# Patient Record
Sex: Female | Born: 1965 | Race: Black or African American | Hispanic: No | Marital: Married
Health system: Midwestern US, Community
[De-identification: ages and names within clinical notes are randomized; demographics above are authoritative.]

---

## 2018-10-03 ENCOUNTER — Ambulatory Visit: Admit: 2018-10-03 | Discharge: 2018-10-03 | Payer: BLUE CROSS/BLUE SHIELD

## 2018-10-03 DIAGNOSIS — J209 Acute bronchitis, unspecified: Secondary | ICD-10-CM

## 2018-10-03 MED ORDER — FLUCONAZOLE 150 MG PO TABS
150 MG | ORAL_TABLET | Freq: Every day | ORAL | 0 refills | Status: AC
Start: 2018-10-03 — End: 2018-10-05

## 2018-10-03 MED ORDER — AMOXICILLIN-POT CLAVULANATE 875-125 MG PO TABS
875-125 MG | ORAL_TABLET | Freq: Two times a day (BID) | ORAL | 0 refills | Status: AC
Start: 2018-10-03 — End: 2018-10-13

## 2018-10-03 NOTE — Progress Notes (Signed)
10/03/2018    Clement Husbands (DOB:  1966-06-10) is a 52 y.o. female, here for evaluation of the following medical concerns: sore throat, sinus congestion, cough since 09/29/18.    HPI  52 yo female here today with c/o sore throat, congestion and cough since 09/29/18. Rates illness 5/10 on pain scale. Taking OTC meds mucinex, nyquil and ibuprofen and they help.    Review of Systems   Constitutional: Positive for fatigue and fever.   HENT: Positive for congestion, postnasal drip and sore throat.    Eyes: Negative.    Respiratory: Positive for cough.    Cardiovascular: Negative.    Gastrointestinal: Negative.    Endocrine: Negative.    Genitourinary: Negative.    Musculoskeletal: Negative.    Skin: Negative.    Allergic/Immunologic: Positive for environmental allergies.   Neurological: Negative.    Hematological: Negative.    Psychiatric/Behavioral: Negative.           Prior to Visit Medications    Not on File        Allergies   Allergen Reactions   ??? Ibuprofen Swelling       History reviewed. No pertinent past medical history.    History reviewed. No pertinent surgical history.    Social History     Socioeconomic History   ??? Marital status: Married     Spouse name: Not on file   ??? Number of children: Not on file   ??? Years of education: Not on file   ??? Highest education level: Not on file   Occupational History   ??? Not on file   Social Needs   ??? Financial resource strain: Not on file   ??? Food insecurity:     Worry: Not on file     Inability: Not on file   ??? Transportation needs:     Medical: Not on file     Non-medical: Not on file   Tobacco Use   ??? Smoking status: Never Smoker   ??? Smokeless tobacco: Never Used   Substance and Sexual Activity   ??? Alcohol use: Never     Frequency: Never   ??? Drug use: Never   ??? Sexual activity: Not on file   Lifestyle   ??? Physical activity:     Days per week: Not on file     Minutes per session: Not on file   ??? Stress: Not on file   Relationships   ??? Social connections:     Talks on  phone: Not on file     Gets together: Not on file     Attends religious service: Not on file     Active member of club or organization: Not on file     Attends meetings of clubs or organizations: Not on file     Relationship status: Not on file   ??? Intimate partner violence:     Fear of current or ex partner: Not on file     Emotionally abused: Not on file     Physically abused: Not on file     Forced sexual activity: Not on file   Other Topics Concern   ??? Not on file   Social History Narrative   ??? Not on file        History reviewed. No pertinent family history.    Vitals:    10/03/18 1201   BP: 103/68   Pulse: 73   Resp: 18   Temp: 97.5 ??F (36.4 ??C)  TempSrc: Tympanic   SpO2: 96%   Weight: 155 lb (70.3 kg)     There is no height or weight on file to calculate BMI.    Physical Exam   Constitutional: She is oriented to person, place, and time. She appears well-developed and well-nourished.   HENT:   Head: Normocephalic and atraumatic.   Right Ear: External ear normal.   Left Ear: External ear normal.   Green mucous, red oropharynx, visible green post nasal drip.    Eyes: Pupils are equal, round, and reactive to light. Conjunctivae and EOM are normal.   Neck: Normal range of motion. Neck supple.   Cardiovascular: Normal rate, regular rhythm, normal heart sounds and intact distal pulses.   Pulmonary/Chest: Effort normal. She has wheezes.   Exp wheezes occasionally   Abdominal: Soft. Bowel sounds are normal.   Musculoskeletal: Normal range of motion.   Neurological: She is alert and oriented to person, place, and time.   Skin: Skin is warm and dry. Capillary refill takes less than 2 seconds.   Psychiatric: She has a normal mood and affect. Her behavior is normal. Thought content normal.   Vitals reviewed.      ASSESSMENT/PLAN:  1. Acute bronchitis-augmentin 875 mg po bid X 10 days.Diflucan on day 5 and 10 of antibiotics. Increase fluid intake. Continue mucinex daily until cough is gone.   2. Return to clinic if no  improvement in symptoms.        An  electronic signature was used to authenticate this note.    --Paul Half, APRN - CNP on 10/03/2018 at 12:56 PM

## 2019-09-16 IMAGING — MR MRI RIGHT SHOULDER WITHOUT CONTRAST
4 of 6 series · 22 of 40 positions shown · IV contrast (gadolinium)
Comparison: None.

MRI RIGHT SHOULDER WITHOUT CONTRAST, 09/16/2019 [DATE]: 
CLINICAL INDICATION: Evaluate for recurrent rotator cuff tear. Patient reports 
right rotator cuff repair last year. History of frozen shoulder for 5 months.
TECHNIQUE: Multiplanar, multiecho position MR images of the right shoulder were 
performed without intravenous gadolinium enhancement.

[Series 201: survey mst · axial · 10.0mm · 0.99mm/px · z∈[-40,+175]mm · 4 of 15 slices shown]
[im 1/15]
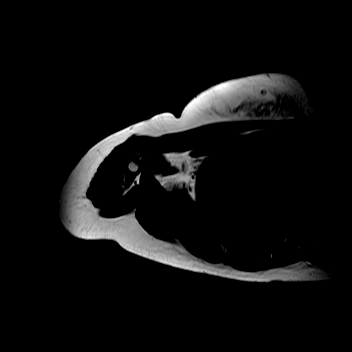
[im 5/15]
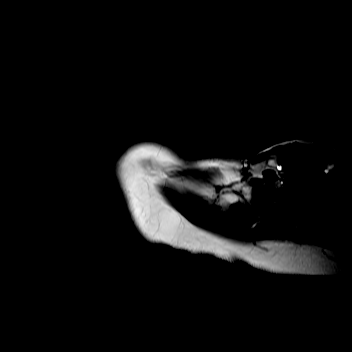
[im 10/15]
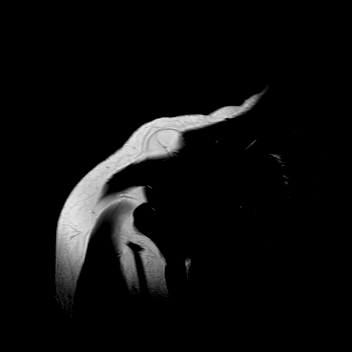
[im 15/15]
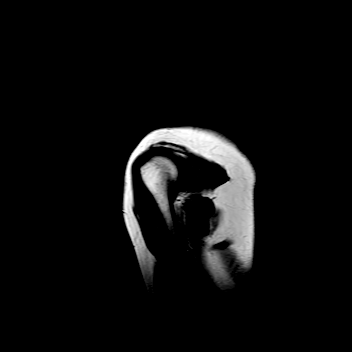

[Series 301: (person_name)_(person_name)_(person_name)* · axial · 3.0mm · 0.35mm/px · z∈[-36,+53]mm · 8 of 28 slices shown]
[im 1/28]
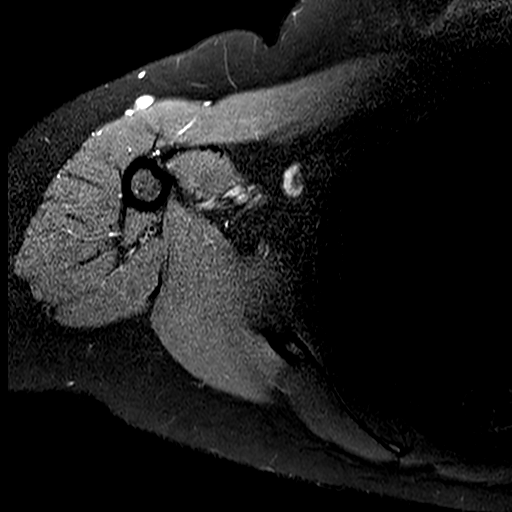
[im 4/28]
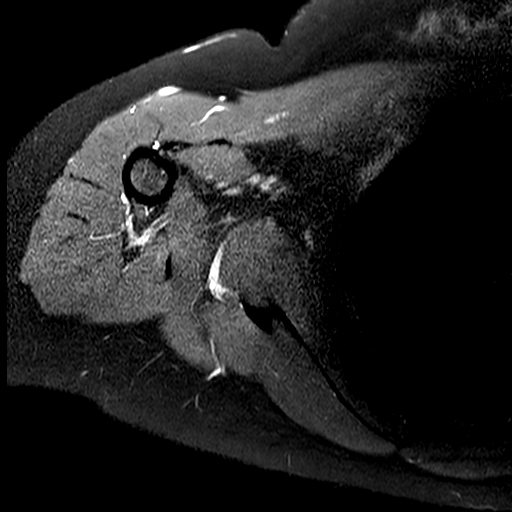
[im 8/28]
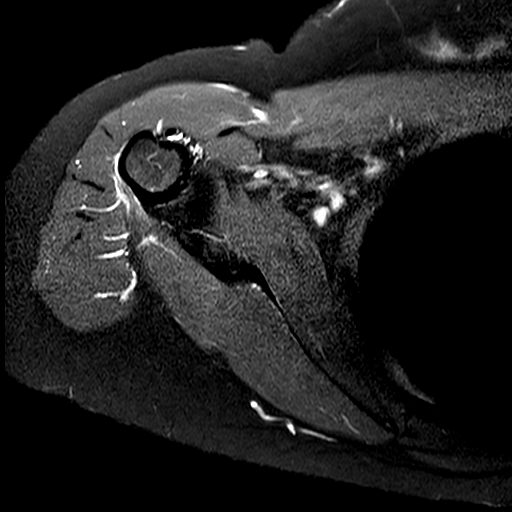
[im 12/28]
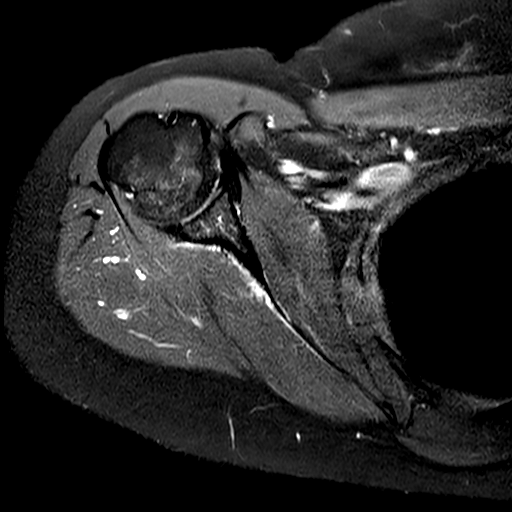
[im 16/28]
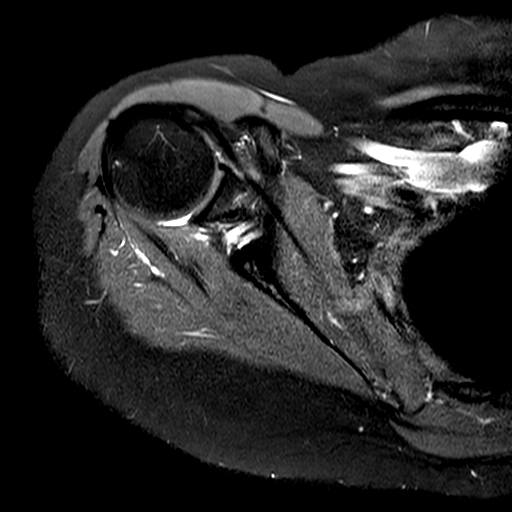
[im 20/28]
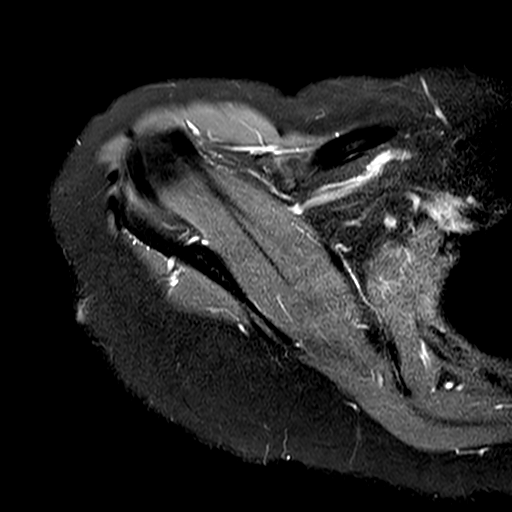
[im 24/28]
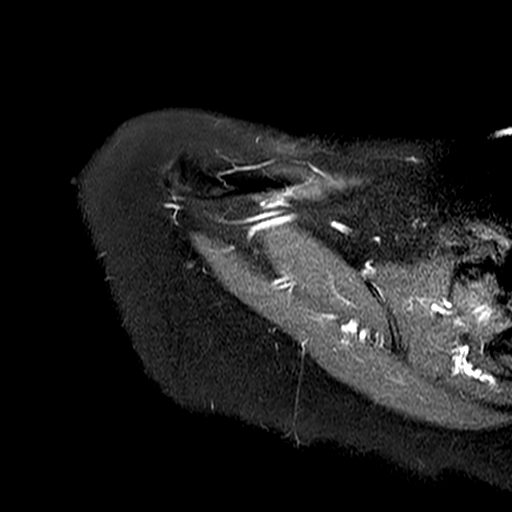
[im 28/28]
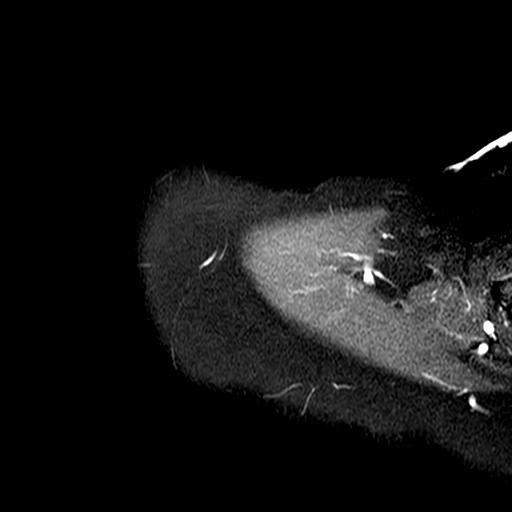

[Series 401: t2_fs_sag* · oblique · 3.0mm · 0.37mm/px · 7 of 30 slices shown]
[im 1/30]
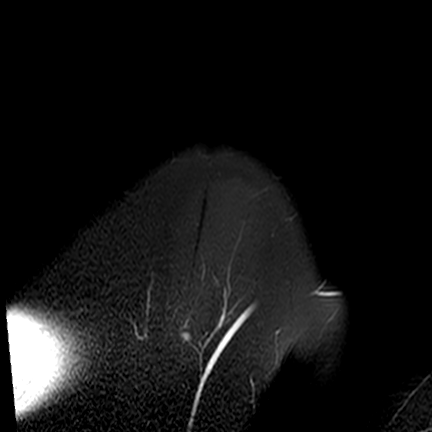
[im 5/30]
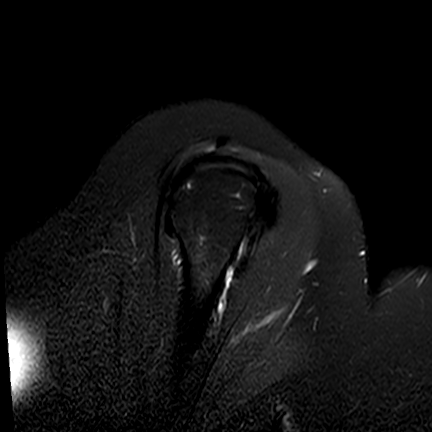
[im 9/30]
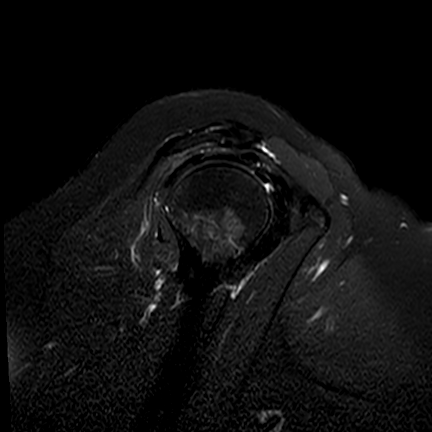
[im 13/30]
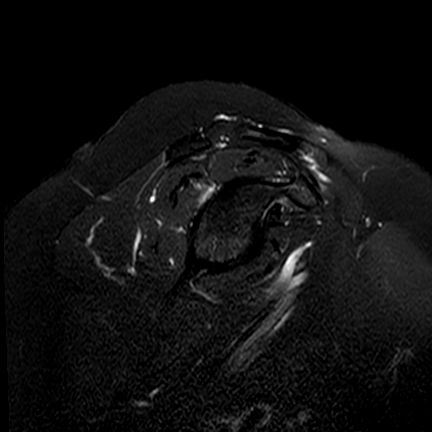
[im 17/30]
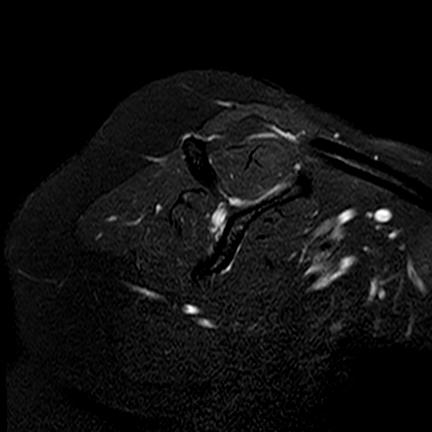
[im 21/30]
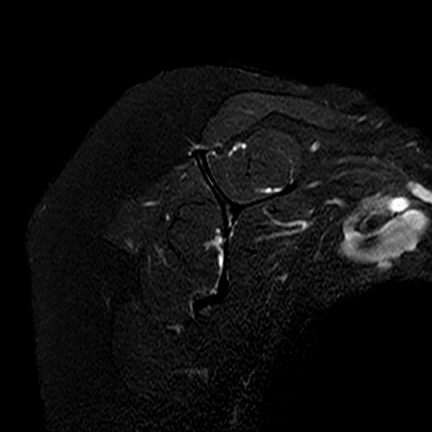
[im 25/30]
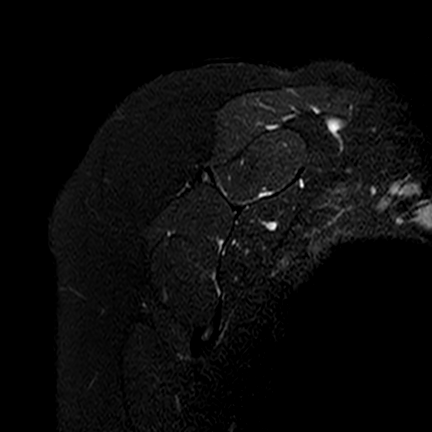

[Series 501: t1_sag* · oblique · 3.0mm · 0.29mm/px · 3 of 30 slices shown]
[im 5/30]
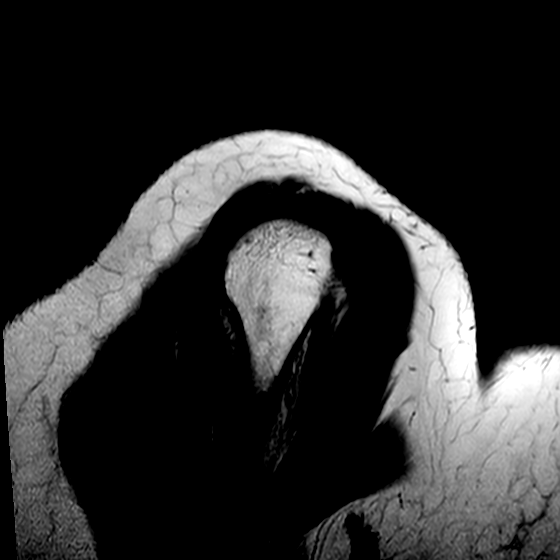
[im 17/30]
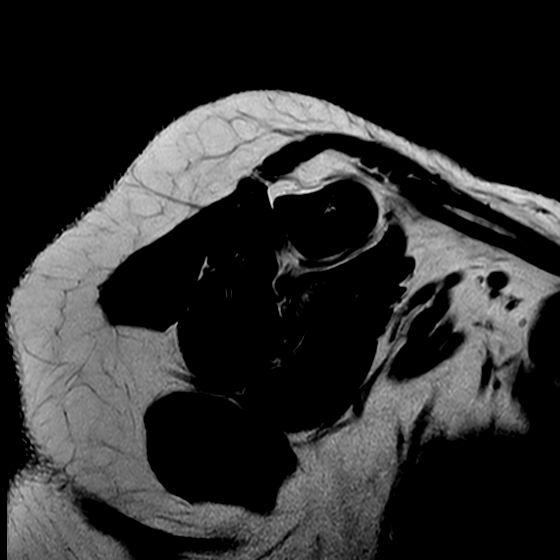
[im 25/30]
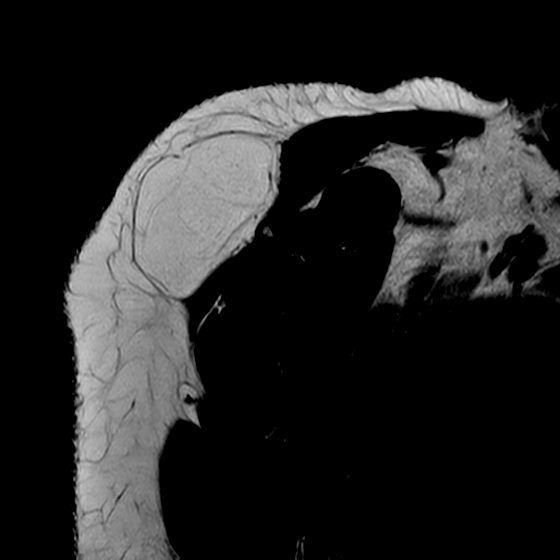

[22 of 40 positions shown; findings below may reference images not displayed]

FINDINGS: ROTATOR CUFF: The supraspinatus and infraspinatus tendons are intact. No 
high-grade or partial rotator cuff tear. The subscapularis and teres minor 
tendons are preserved. No postsurgical susceptibility artifact identified. The 
rotator cuff musculature is symmetric without mass, signal abnormality or 
atrophy. 
ACROMIOCLAVICULAR JOINT: The acromioclavicular joint is preserved. The 
coracoacromial ligament is intact without prominent spurring at the acromial 
attachment. The acromioclavicular and coracoclavicular ligaments are preserved. 
The acromium is normal in morphology. 
GLENOHUMERAL JOINT: The humeral head is well located within the glenoid fossa. 
The glenoid labrum is preserved. No paralabral cyst. The intra-articular portion 
of the long head of the biceps tendon is negative. No shoulder joint effusion. 
The inferior glenohumeral ligament/capsule is normal in thickness and signal 
intensity. 
BONES AND SOFT TISSUES: The bone marrow signal intensity is negative for 
fracture. No Hill-Sachs defect. The axillary region is negative. 5.4 x 3.0 x
cm fatty mass in the posterior shoulder subcutaneous tissues with thin 
septations and no nodular nonadipose components, most consistent with lipoma.
IMPRESSION: 1. No intra-articular pathology identified. 
2. No imaging features to suggest adhesive capsulitis. 
3. 5.4 x 3.0 x 5.9 cm fatty mass in the posterior shoulder subcutaneous tissues, 
most consistent with lipoma.

## 2021-02-19 IMAGING — MR MRI CERVICAL SPINE WITHOUT CONTRAST
4 series · 25 of 48 positions shown · IV contrast (gadolinium)
Comparison: None

MRI CERVICAL SPINE WITHOUT CONTRAST, 02/19/2021 [DATE]: 
CLINICAL INDICATION: Neck pain
TECHNIQUE: Sagittal T1, Sagittal T2, Sagittal STIR, Axial TSE and Axial 1K00O 
images of the cervical spine were performed without intravenous gadolinium 
enhancement.

[Series 101: survey* · axial · 10.0mm · 1.56mm/px · z∈[-30,+199]mm · 9 of 15 slices shown]
[im 1/15]
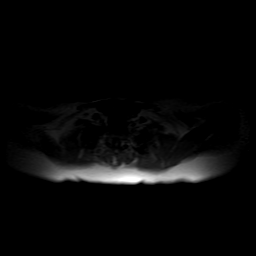
[im 2/15]
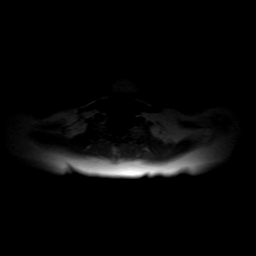
[im 4/15]
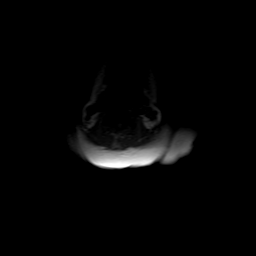
[im 6/15]
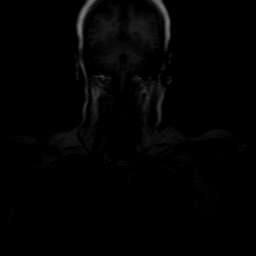
[im 8/15]
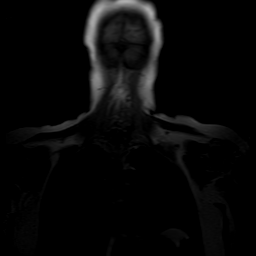
[im 9/15]
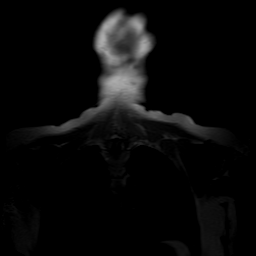
[im 11/15]
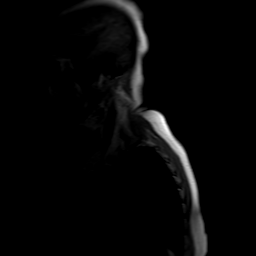
[im 13/15]
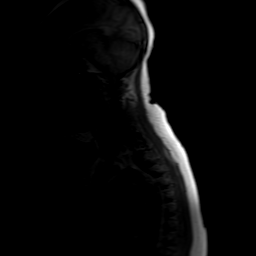
[im 15/15]
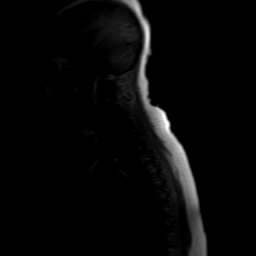

[Series 301: t1_(person_name) · sagittal · 3.0mm · 0.36mm/px · 8 of 15 slices shown]
[im 1/15]
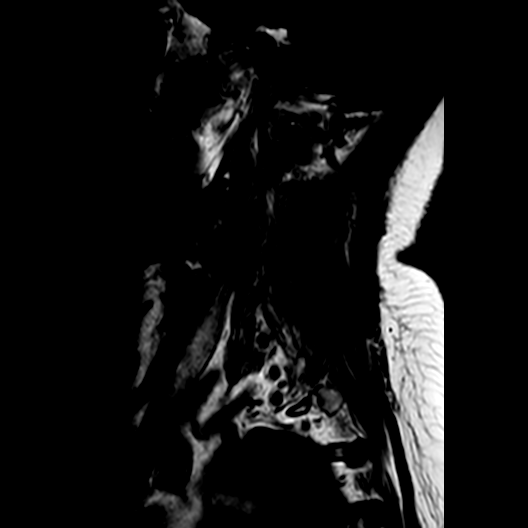
[im 2/15]
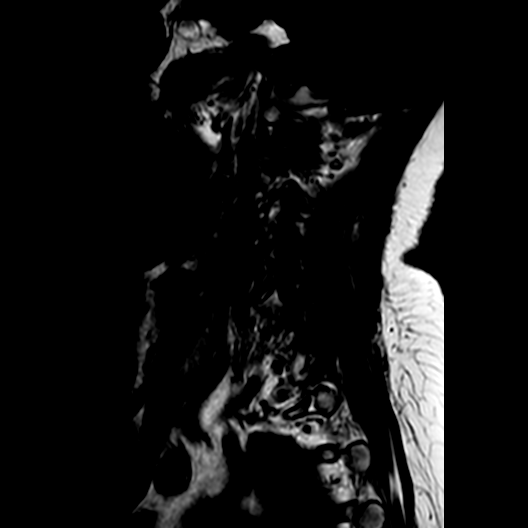
[im 5/15]
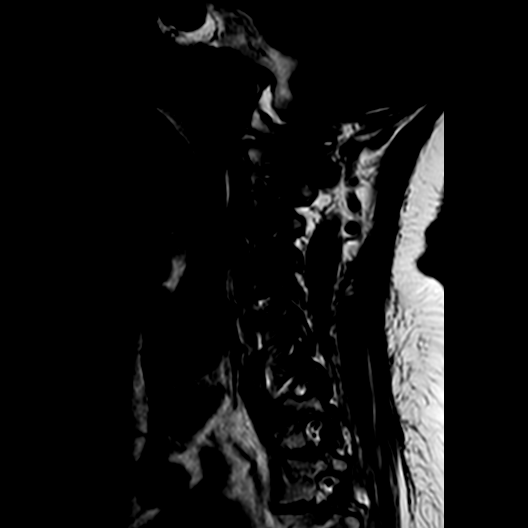
[im 7/15]
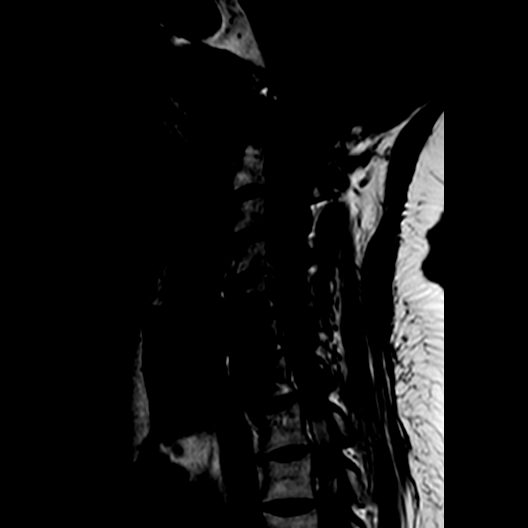
[im 8/15]
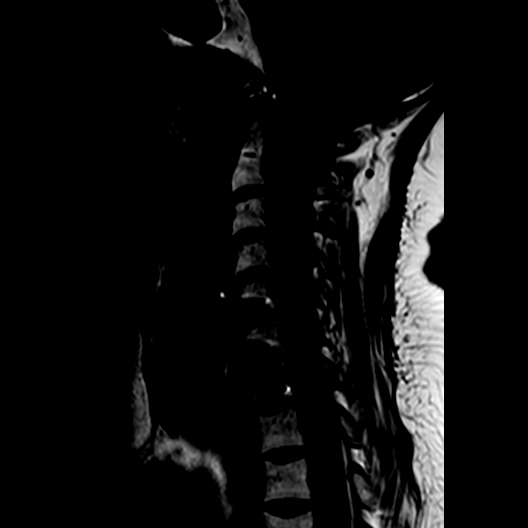
[im 10/15]
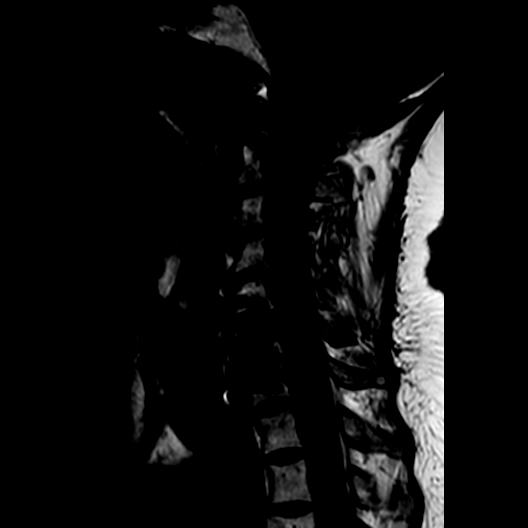
[im 13/15]
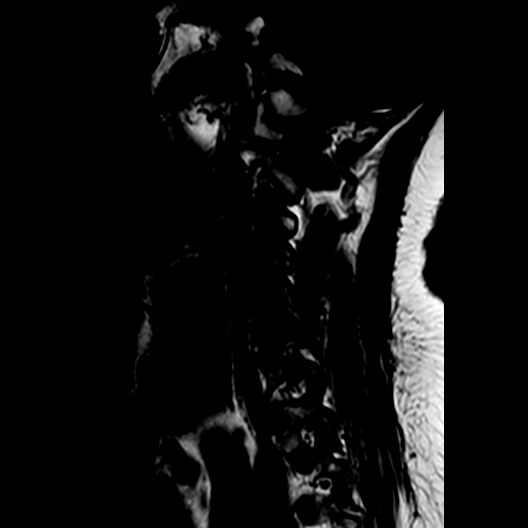
[im 15/15]
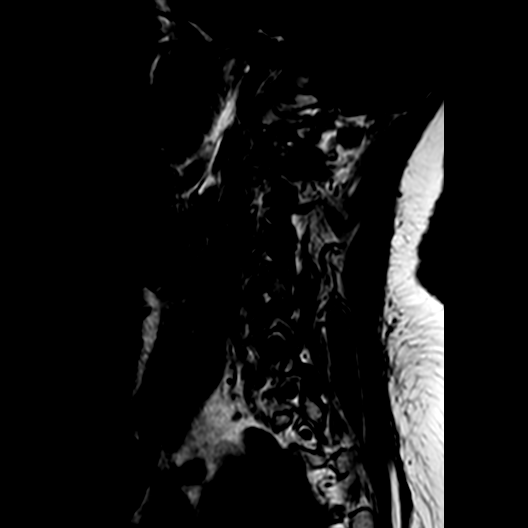

[Series 401: t2_sag · sagittal · 3.0mm · 0.36mm/px · 5 of 15 slices shown]
[im 1/15]
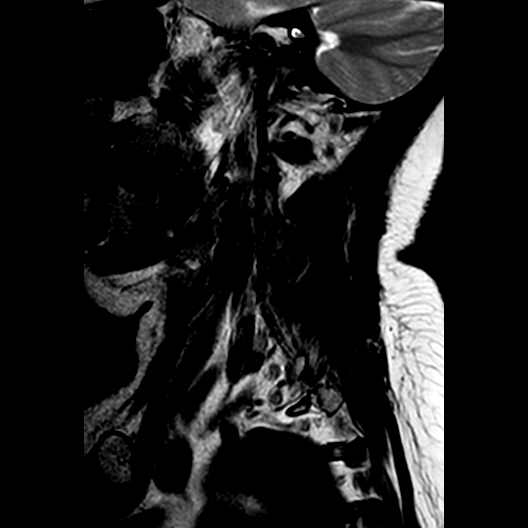
[im 2/15]
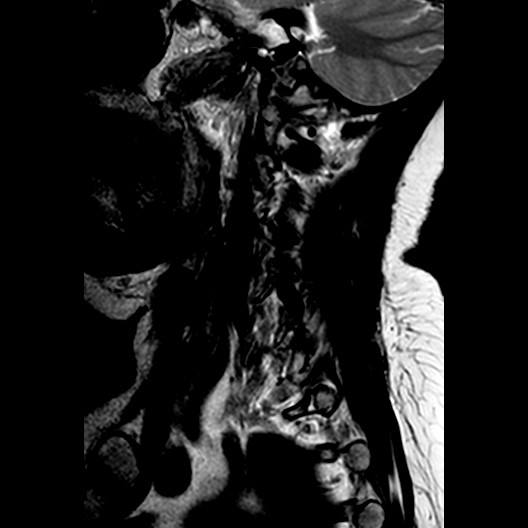
[im 5/15]
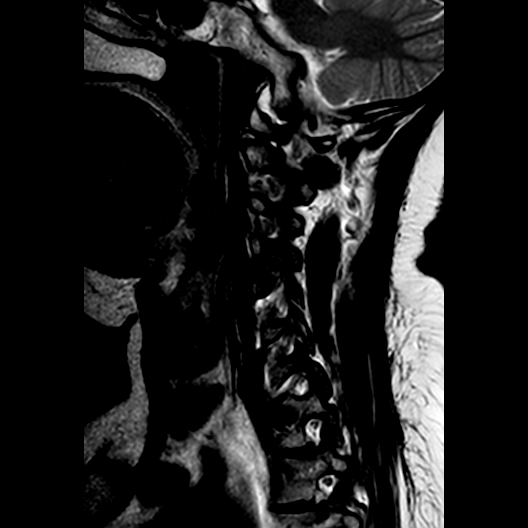
[im 8/15]
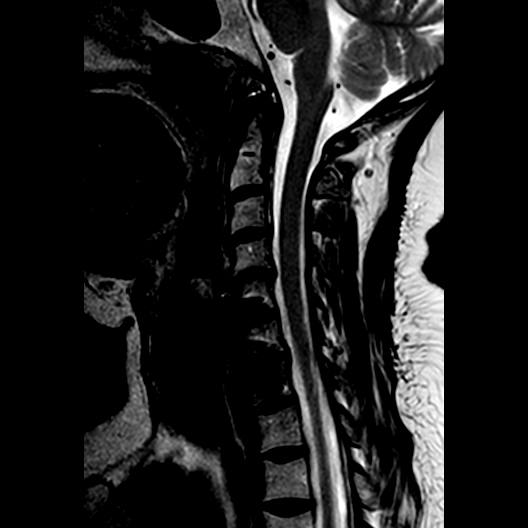
[im 13/15]
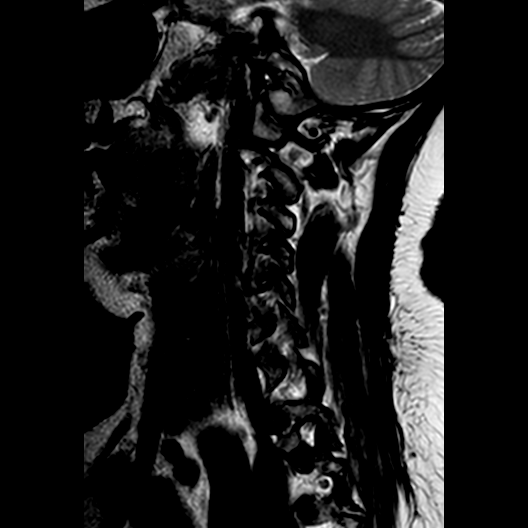

[Series 601: t2_tse_(id) (person_name) · axial · 3.0mm · 0.25mm/px · z∈[-44,+15]mm · 3 of 30 slices shown]
[im 5/30]
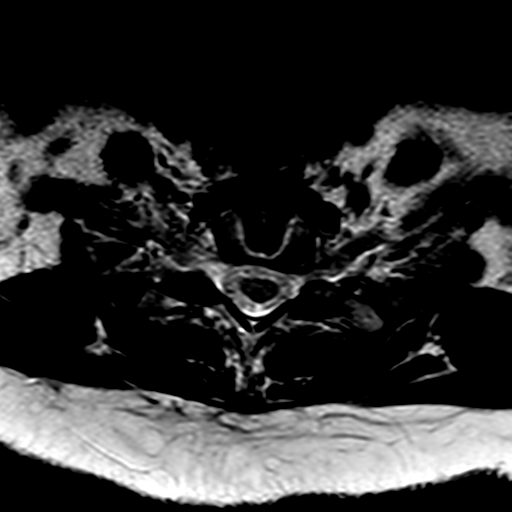
[im 15/30]
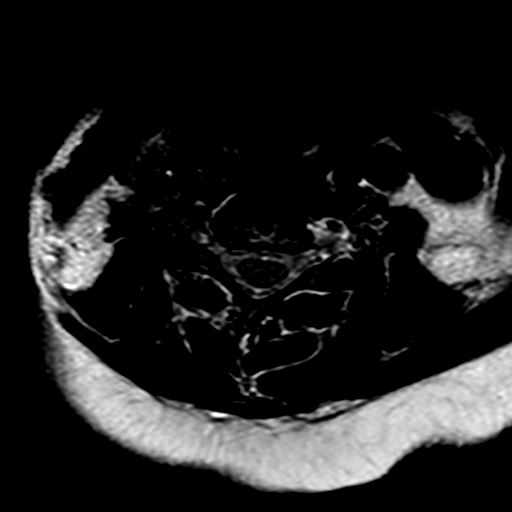
[im 25/30]
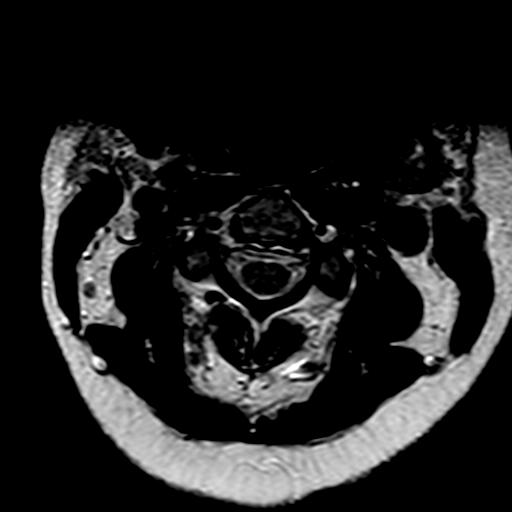

[25 of 48 positions shown; findings below may reference images not displayed]

FINDINGS: Cervical vertebral heights are intact. Patient is status post C5-C7 
ACDF with anterior plate and screw fusion. There is marked disc narrowing at 
C4-5, moderate at C7-T1. Cord signal is normal. The craniocervical junction is 
open. The dens and atlanto-dental interval are intact. Lower posterior fossa 
contents are unremarkable. There is no evidence for recent fracture or spinal 
malignancy. There are zygapophyseal facet degenerative changes. 
Axial images from C2-C3 through C7-T1 show mild disc bulge at C3-4 and C4-5 not 
touching the cord. There is no significant cervical stenosis. There is mild 
disc-osteophyte at C6-7 not touching the cord. There is moderate left foraminal 
stenosis at C4-5. Other cervical foramina are open..
IMPRESSION: Fusion changes as described. There is no significant residual canal stenosis. 
Moderate left foraminal stenosis at C4-5 appears to impinge the left C5 nerve 
root. There is disc bulge at this level not touching the cord. 
No Modic type I changes identified.

## 2022-04-14 IMAGING — MR MRI CERVICAL SPINE WITHOUT CONTRAST
4 of 8 series · 11 of 48 positions shown · IV contrast (gadolinium)
Comparison: MR exam of 02/19/2021.

________________________________________________________________________________________________ 
MRI CERVICAL SPINE WITHOUT CONTRAST, 04/14/2022 [DATE]: 
CLINICAL INDICATION: Cervicalgia. Chronic neck pain since 0811. All symptoms 
were produced on the right side of a nodule up to the left side 2 months ago. 
Left-sided pain and numbness on the mid biceps area. No trauma. History of 
cervical fusion..
TECHNIQUE: Multiplanar, multiecho position MR images of the cervical spine were 
performed without intravenous gadolinium enhancement. Patient was scanned on a 
1.5T magnet.

[Series 101: survey · axial · 10.0mm · 1.25mm/px · z∈[-6,+200]mm · 3 of 10 slices shown]
[im 1/10]
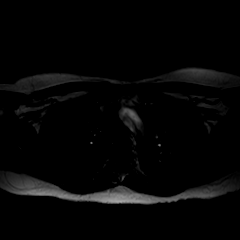
[im 5/10]
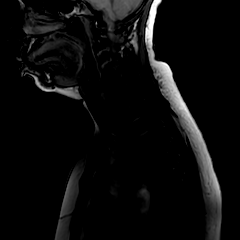
[im 10/10]
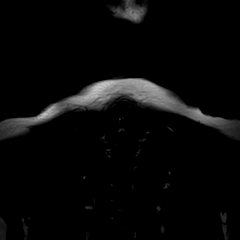

[Series 201: t2w_cor-surv · coronal · 5.0mm · 0.69mm/px · 2 of 7 slices shown]
[im 1/7]
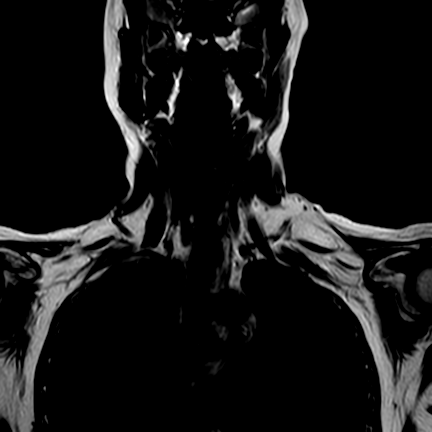
[im 7/7]
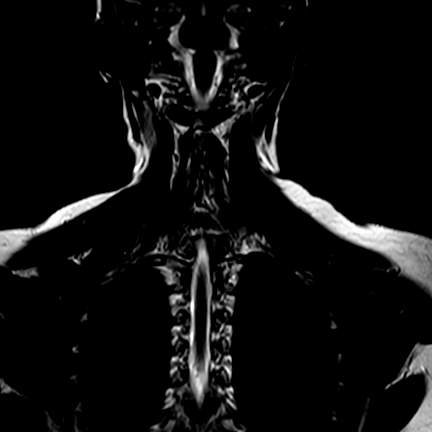

[Series 301: t1_sag · sagittal · 3.0mm · 0.35mm/px · 3 of 15 slices shown]
[im 1/15]
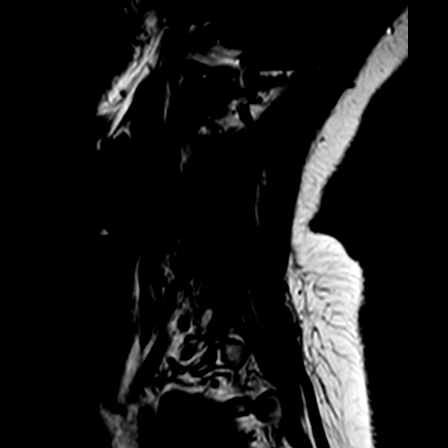
[im 8/15]
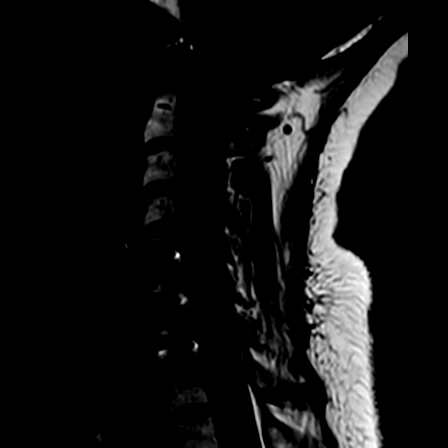
[im 15/15]
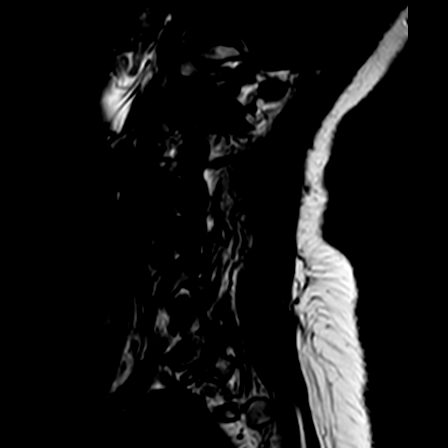

[Series 402: v3d spine axial · axial · 1.0mm · 0.22mm/px · z∈[-27,+98]mm · 3 of 23 slices shown]
[im 1/23]
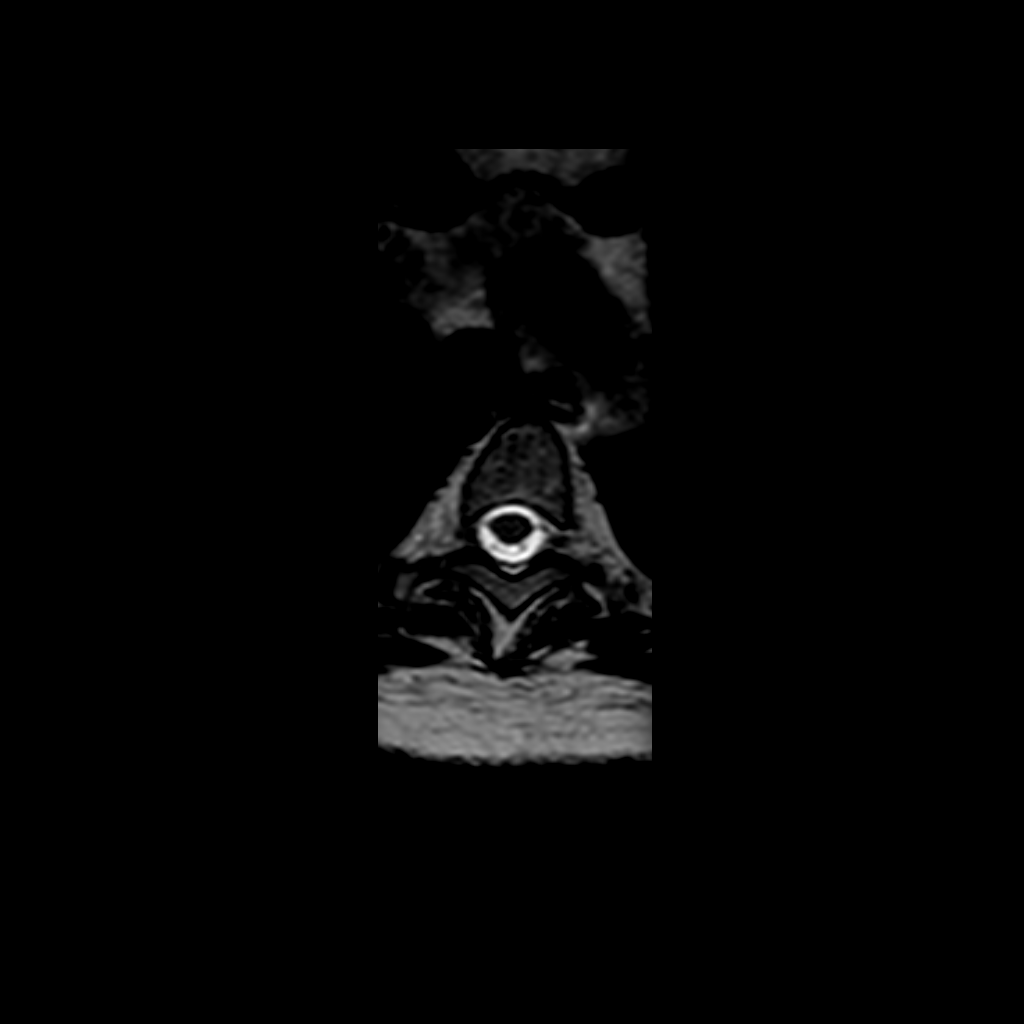
[im 12/23]
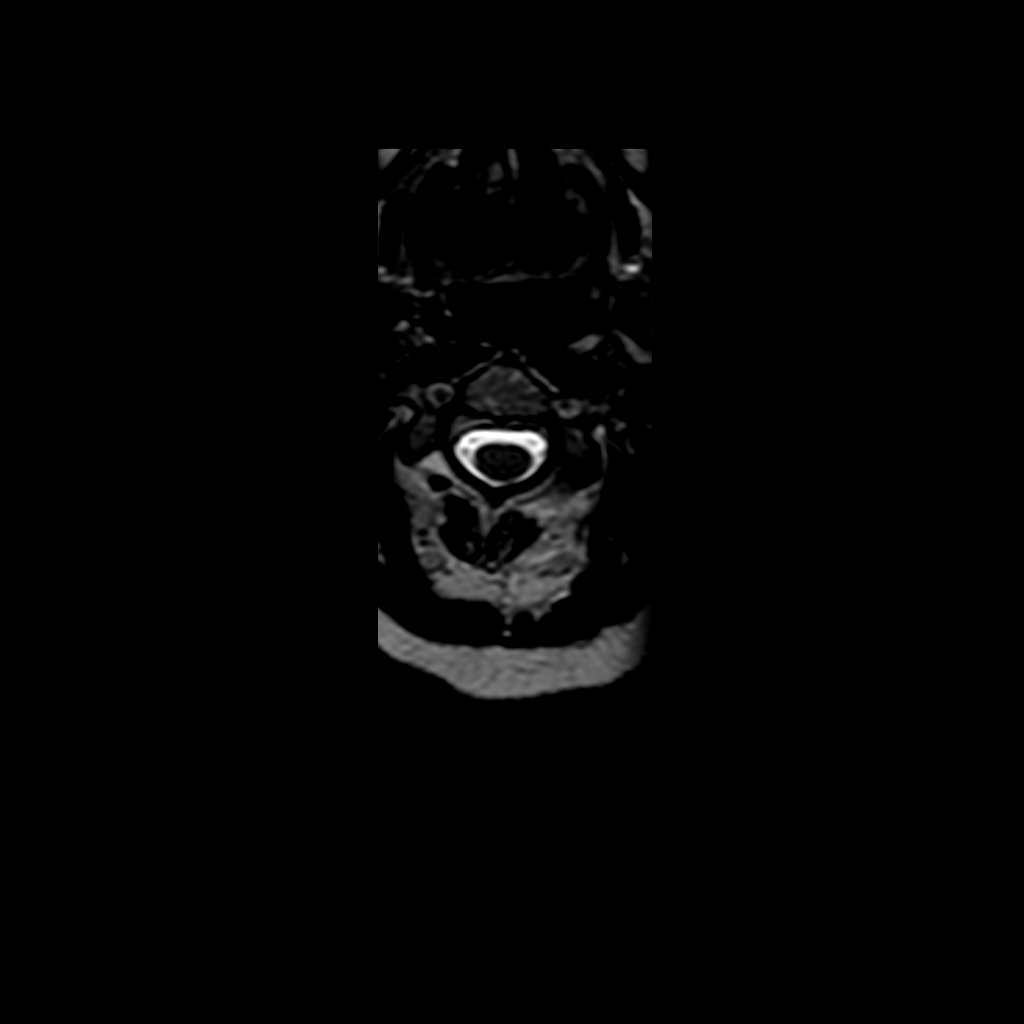
[im 23/23]
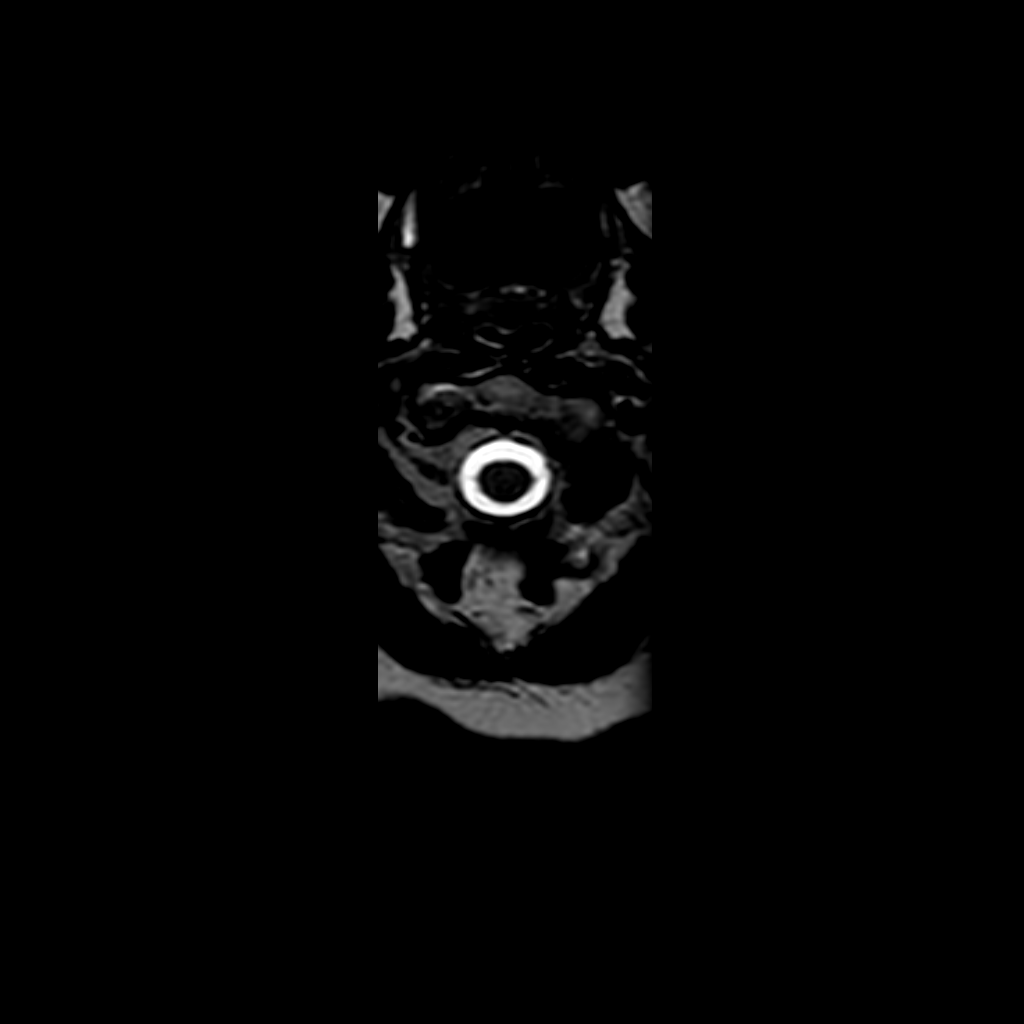

[11 of 48 positions shown; findings below may reference images not displayed]

FINDINGS: -------------------------------------------------------------------------------- 
----------------- 
GENERAL: 
Postsurgical changes of ACDF C5-C7. No vertebral body fracture. No 
spondylolisthesis. No marrow edema or stress response. 
-------------------------------------------------------------------------------- 
---------------- 
SEGMENTAL: 
Mild degenerative changes of the anterior C1-C2 articulation with mild 
osteophytic spurring. 
C2-C3: Normal disc height. No herniation. Normal facets. No spinal canal or 
neural foraminal stenosis. 
C3-C4: Normal disc height. No herniation. Normal facets. No spinal canal or 
neural foraminal stenosis. 
C4-C5: Mild disc height loss. Mild dorsal disc osteophyte complex. There is 
uncovertebral and facet hypertrophy with moderate to severe bilateral neural 
foraminal stenoses with potential for bilateral C5 neural impingement.  
C5-C6: Anterior fusion. No spinal canal or neural foraminal stenosis. 
C6-C7: Anterior fusion. No spinal canal or neural foraminal stenosis. 
C7-T1: Normal disc height. No herniation. Normal facets. No spinal canal or 
neural foraminal stenosis. 
-------------------------------------------------------------------------------- 
--------------- 
Compared to the prior MR exam Seed been slight interval progression of the 
neural foraminal stenoses at C4-C5. Remainder otherwise unchanged.
IMPRESSION: 1.  Post surgical changes of anterior fusion C5-C7. 
2.  Spondylotic changes with bilateral moderate to severe neural foraminal 
stenoses with potential for neural impingement.

## 2022-04-26 IMAGING — MR MRI LEFT SHOULDER WITHOUT CONTRAST
4 of 6 series · 23 of 40 positions shown · IV contrast (gadolinium)
Comparison: None prior of the left shoulder. MR exam of the right shoulder 
09/16/2019.

________________________________________________________________________________________________ 
MRI LEFT SHOULDER WITHOUT CONTRAST, 04/26/2022 [DATE]: 
CLINICAL INDICATION: Pain. Limited range of motion.
TECHNIQUE: Multiplanar, multiecho position MR images of the shoulder were 
performed without intravenous gadolinium enhancement. Patient was scanned on a 
1.5T magnet.

[Series 201: survey left · axial · left · 10.0mm · 0.71mm/px · z∈[-40,+125]mm · 5 of 15 slices shown]
[im 1/15]
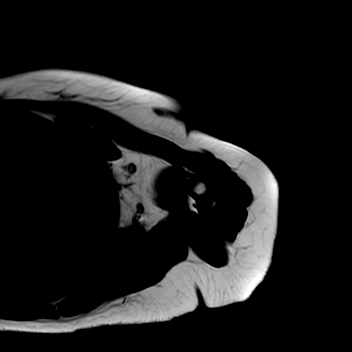
[im 4/15]
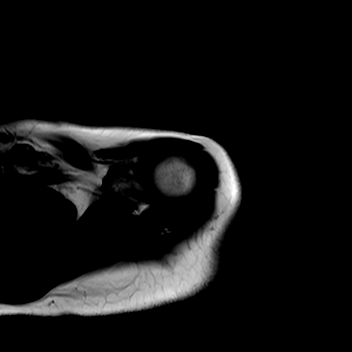
[im 8/15]
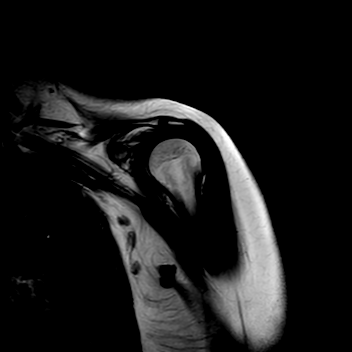
[im 11/15]
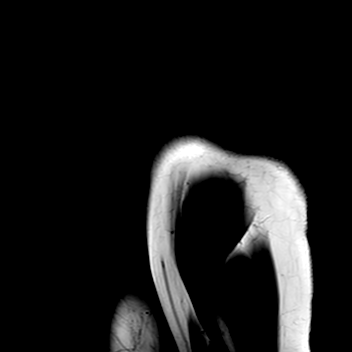
[im 15/15]
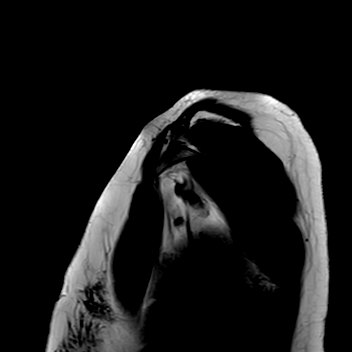

[Series 301: (person_name)_(person_name)_(person_name) · axial · left · 3.0mm · 0.35mm/px · z∈[-74,+12]mm · 9 of 28 slices shown]
[im 1/28]
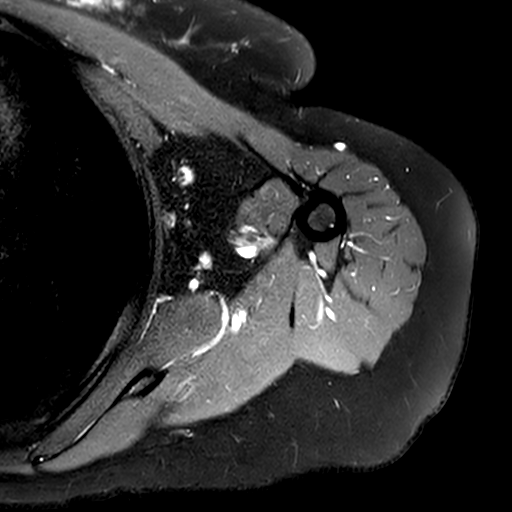
[im 4/28]
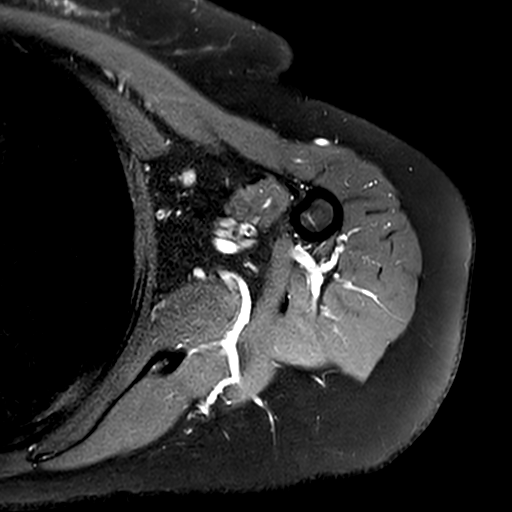
[im 7/28]
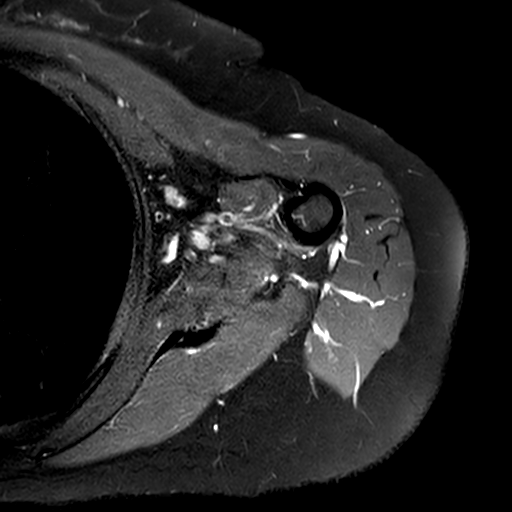
[im 11/28]
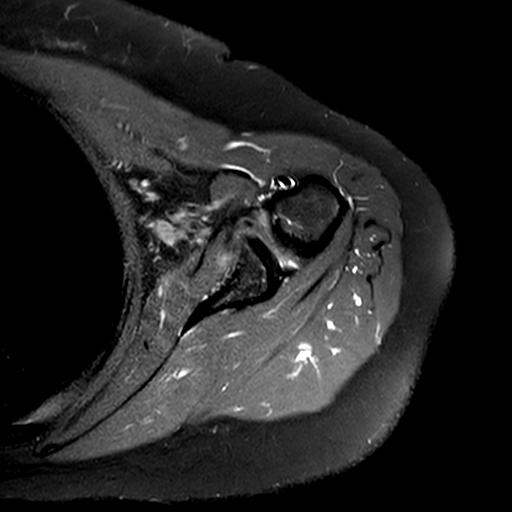
[im 14/28]
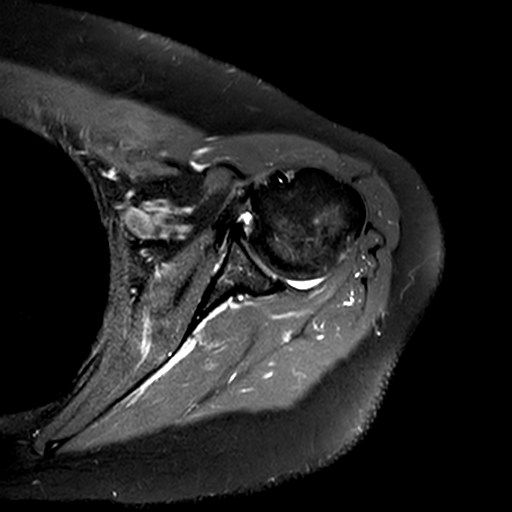
[im 17/28]
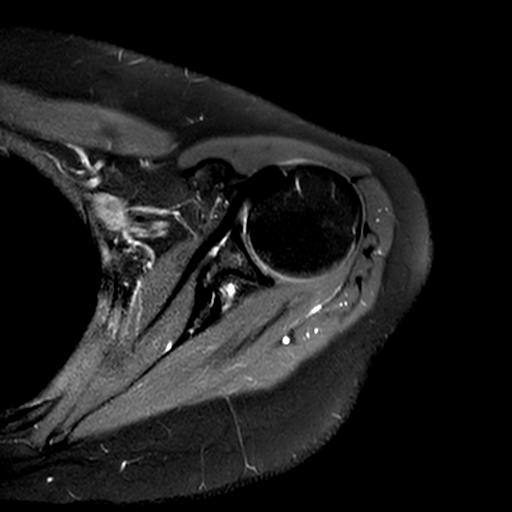
[im 21/28]
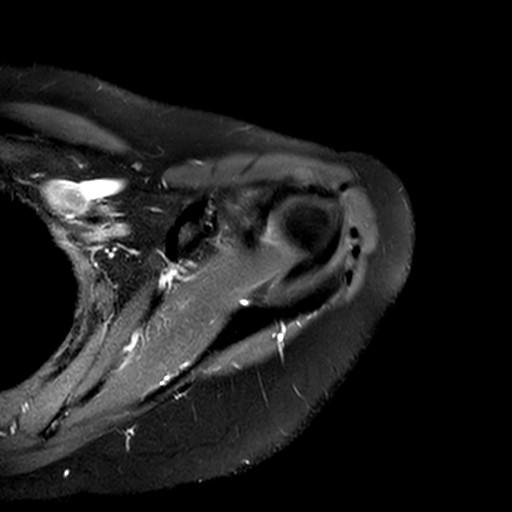
[im 24/28]
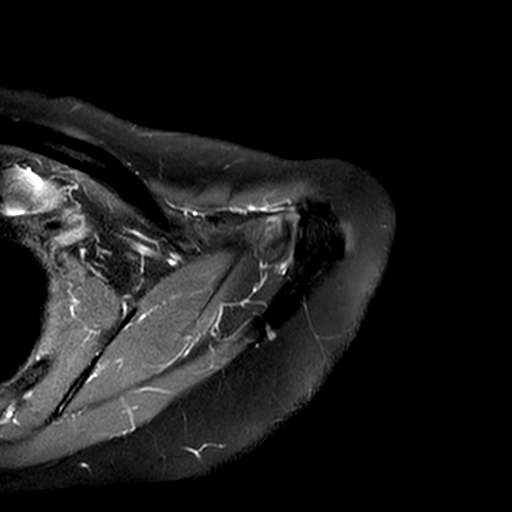
[im 28/28]
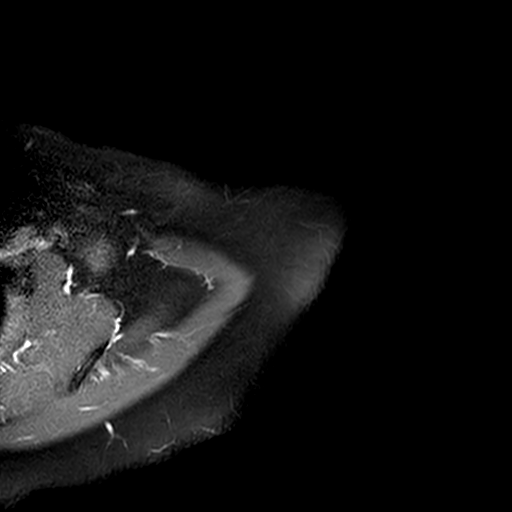

[Series 401: t2_fs_sag left · oblique · left · 3.0mm · 0.37mm/px · 6 of 26 slices shown]
[im 1/26]
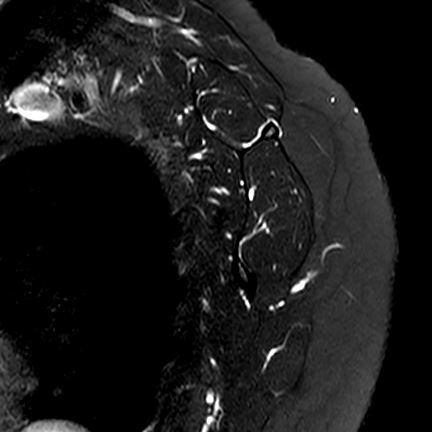
[im 5/26]
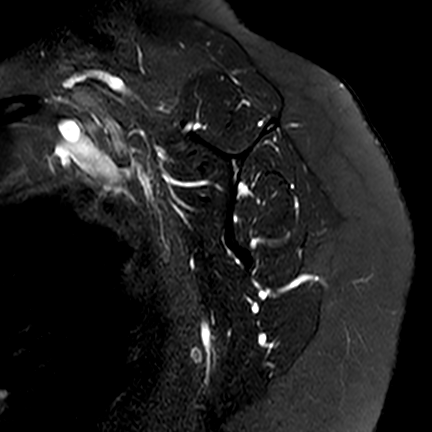
[im 9/26]
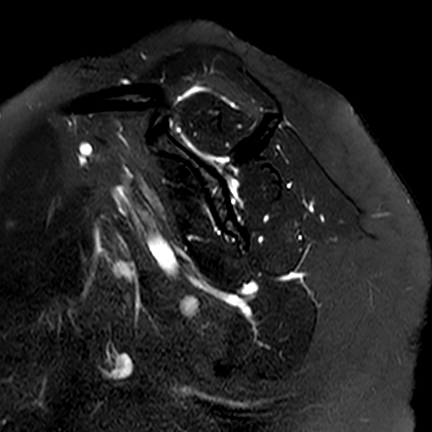
[im 13/26]
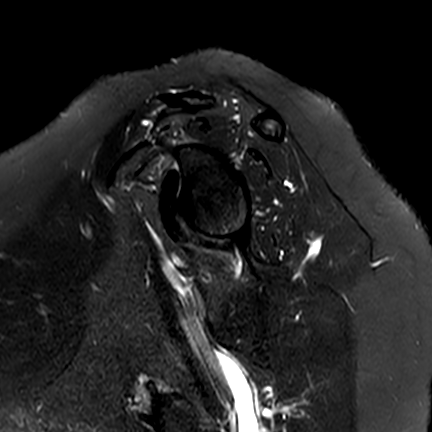
[im 17/26]
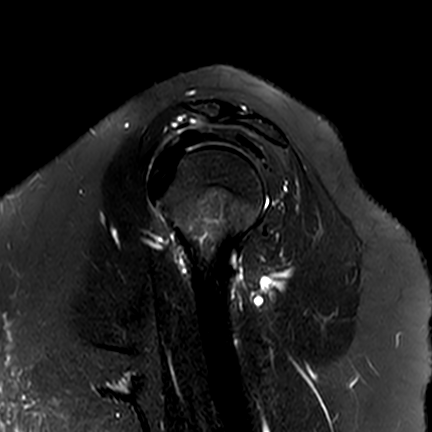
[im 21/26]
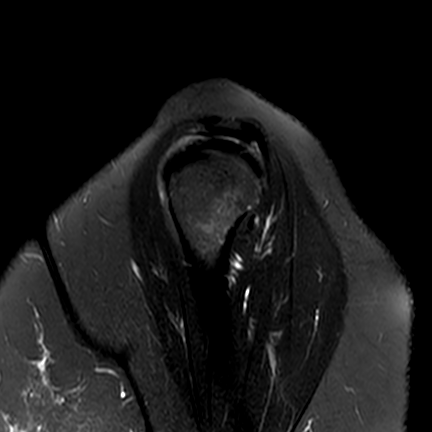

[Series 501: pd_fs_cor left · oblique · left · 3.5mm · 0.34mm/px · 3 of 22 slices shown]
[im 5/22]
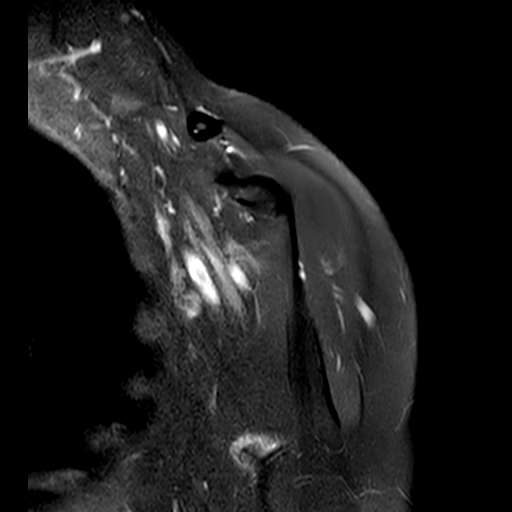
[im 13/22]
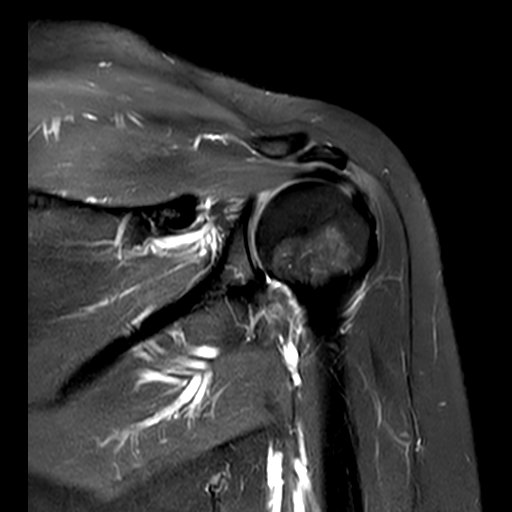
[im 22/22]
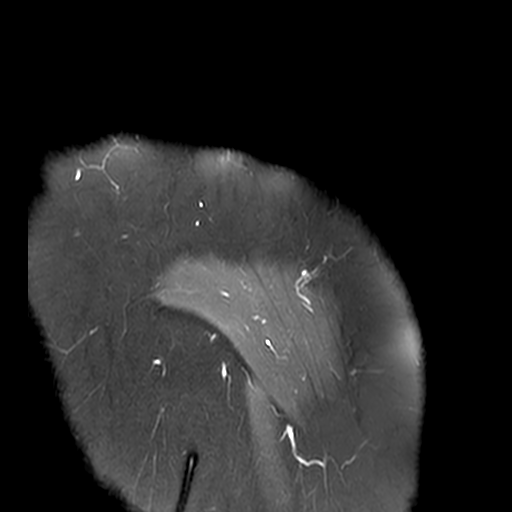

[23 of 40 positions shown; findings below may reference images not displayed]

FINDINGS: ROTATOR CUFF: The supraspinatus, infraspinatus, subscapularis and teres minor 
tendons are intact. The rotator cuff musculature is symmetric without mass, 
signal abnormality or atrophy. 
ACROMIOCLAVICULAR JOINT: Preserved. The coracoacromial ligament is intact 
without prominent spurring at the acromial attachment. The acromioclavicular and 
coracoclavicular ligaments are preserved. The acromium is normal in morphology. 
GLENOHUMERAL JOINT: The humeral head is well located within the glenoid fossa. 
Articular cartilage is preserved.  The glenoid labrum is preserved. No 
paralabral cyst. No significant shoulder joint effusion. 
ROTATOR INTERVAL: The coracohumeral ligament is preserved. The subcoracoid 
triangle fat is preserved. The long head of the biceps tendon is well located 
and is negative. 
BONES: The bone marrow signal intensity is negative for fracture. No Hill-Sachs 
defect. Subcortical cystic change of the humeral head. 
ADDITIONAL FINDINGS: The axillary region is negative. Subcutaneous tissues are 
negative.
IMPRESSION: Normal MR exam of the left shoulder.
